# Patient Record
Sex: Female | Born: 1935 | Race: White | Hispanic: No
Health system: Southern US, Community
[De-identification: ages and names within clinical notes are randomized; demographics above are authoritative.]

---

## 2006-12-12 ENCOUNTER — Other Ambulatory Visit: Payer: Self-pay

## 2006-12-12 ENCOUNTER — Ambulatory Visit: Payer: Self-pay | Admitting: Ophthalmology

## 2006-12-20 ENCOUNTER — Ambulatory Visit: Payer: Self-pay | Admitting: Ophthalmology

## 2007-02-20 ENCOUNTER — Ambulatory Visit: Payer: Self-pay | Admitting: Ophthalmology

## 2007-03-07 ENCOUNTER — Ambulatory Visit: Payer: Self-pay | Admitting: Ophthalmology

## 2009-04-01 ENCOUNTER — Emergency Department: Payer: Self-pay | Admitting: Emergency Medicine

## 2010-03-24 ENCOUNTER — Emergency Department: Payer: Self-pay | Admitting: Emergency Medicine

## 2010-06-17 ENCOUNTER — Emergency Department: Payer: Self-pay | Admitting: Unknown Physician Specialty

## 2010-10-05 ENCOUNTER — Emergency Department: Payer: Self-pay | Admitting: Emergency Medicine

## 2011-02-13 ENCOUNTER — Ambulatory Visit: Payer: Self-pay | Admitting: Internal Medicine

## 2011-02-14 ENCOUNTER — Emergency Department: Payer: Self-pay | Admitting: *Deleted

## 2011-03-10 ENCOUNTER — Inpatient Hospital Stay: Payer: Self-pay | Admitting: Internal Medicine

## 2011-03-16 ENCOUNTER — Ambulatory Visit: Payer: Self-pay | Admitting: Internal Medicine

## 2011-04-10 ENCOUNTER — Emergency Department: Payer: Self-pay | Admitting: Internal Medicine

## 2011-04-10 LAB — CBC
HCT: 34.9 % — ABNORMAL LOW (ref 35.0–47.0)
MCV: 90 fL (ref 80–100)
Platelet: 384 10*3/uL (ref 150–440)
RBC: 3.86 10*6/uL (ref 3.80–5.20)

## 2011-04-10 LAB — COMPREHENSIVE METABOLIC PANEL
Albumin: 3 g/dL — ABNORMAL LOW (ref 3.4–5.0)
Anion Gap: 7 (ref 7–16)
BUN: 25 mg/dL — ABNORMAL HIGH (ref 7–18)
Bilirubin,Total: 0.4 mg/dL (ref 0.2–1.0)
Chloride: 100 mmol/L (ref 98–107)
Creatinine: 0.86 mg/dL (ref 0.60–1.30)
EGFR (African American): >60
Potassium: 3.5 mmol/L (ref 3.5–5.1)
SGPT (ALT): 15 U/L
Total Protein: 6.9 g/dL (ref 6.4–8.2)

## 2011-04-10 LAB — URINALYSIS, COMPLETE
Bilirubin,UR: NEGATIVE
Blood: NEGATIVE
Ketone: NEGATIVE
Leukocyte Esterase: NEGATIVE
Nitrite: NEGATIVE
Ph: 5 (ref 4.5–8.0)
Protein: NEGATIVE
RBC,UR: NONE SEEN /HPF (ref 0–5)
Specific Gravity: 1.023 (ref 1.003–1.030)
Squamous Epithelial: <1
WBC UR: <1 /HPF (ref 0–5)

## 2011-04-10 LAB — TROPONIN I: Troponin-I: 0.02 ng/mL

## 2011-04-10 LAB — CK TOTAL AND CKMB (NOT AT ARMC)
CK, Total: 21 U/L (ref 21–215)
CK-MB: 0.7 ng/mL (ref 0.5–3.6)

## 2011-05-14 DEATH — deceased

## 2013-07-29 IMAGING — CT CT HEAD WITHOUT CONTRAST
2 series · 15 of 30 positions shown, 19 images · non-contrast
Comparison: none

REASON FOR EXAM: head trauma
COMMENTS:

[Series 2: without · axial · non-contrast · 0.43mm/px · z∈[-139,-9]mm · 13 of 32 slices shown, 17 images]
[im 3/32  brain]
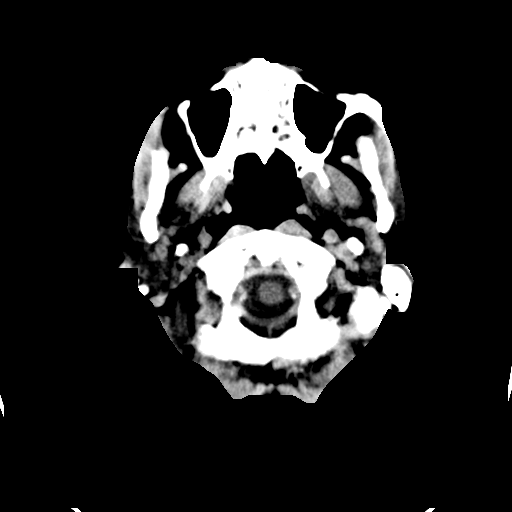
[im 3/32  bone]
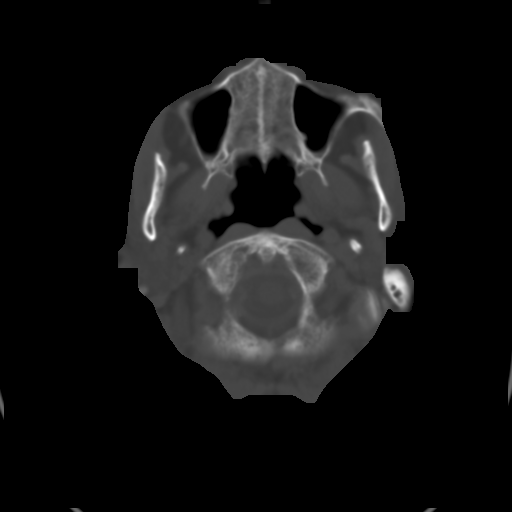
[im 5/32  brain]
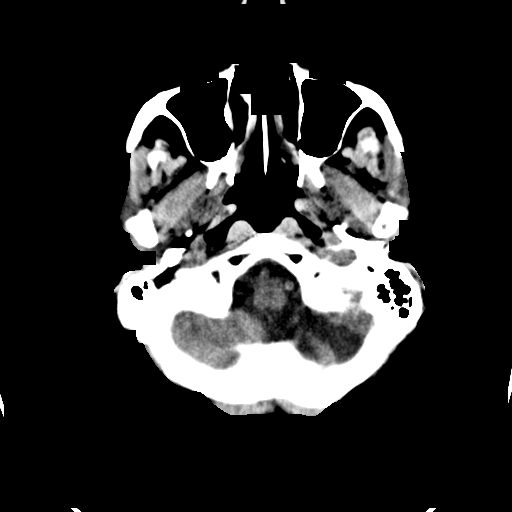
[im 7/32  brain]
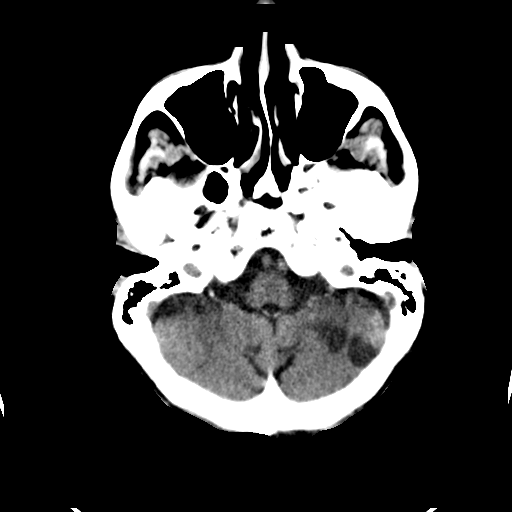
[im 9/32  brain]
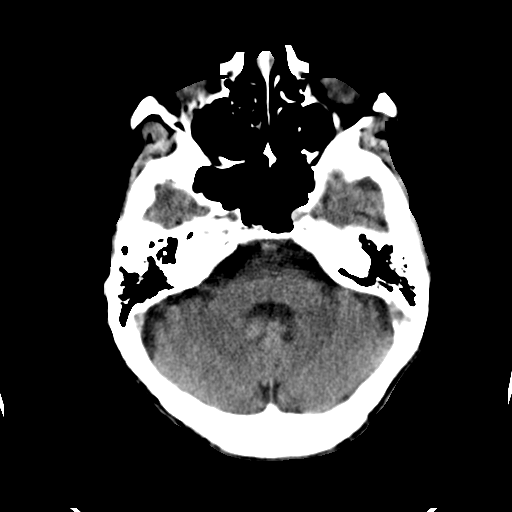
[im 12/32  brain]
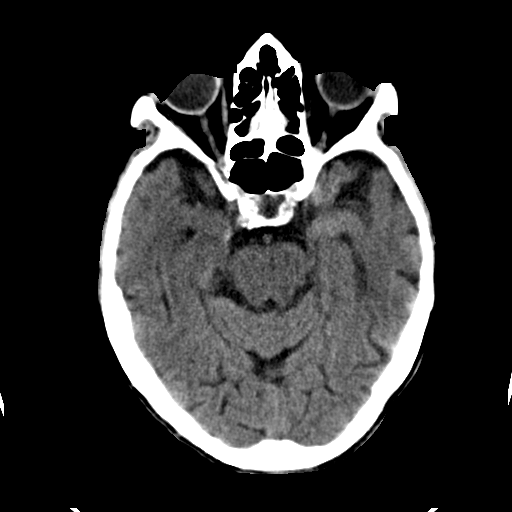
[im 12/32  bone]
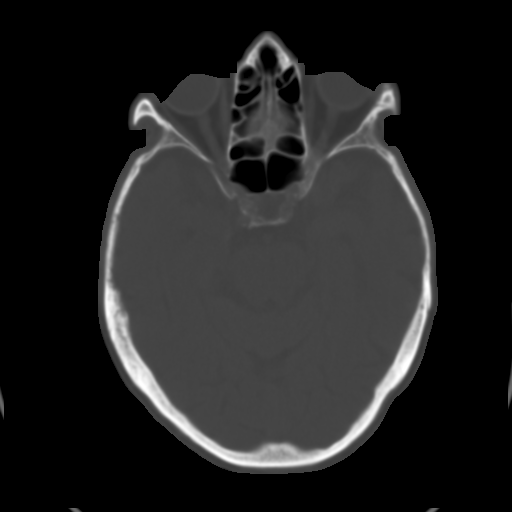
[im 14/32  brain]
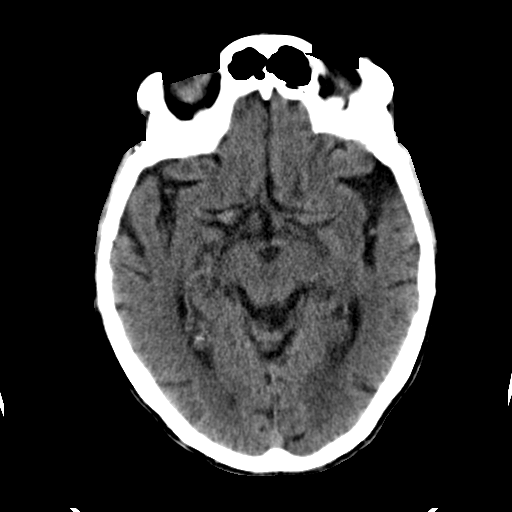
[im 16/32  brain]
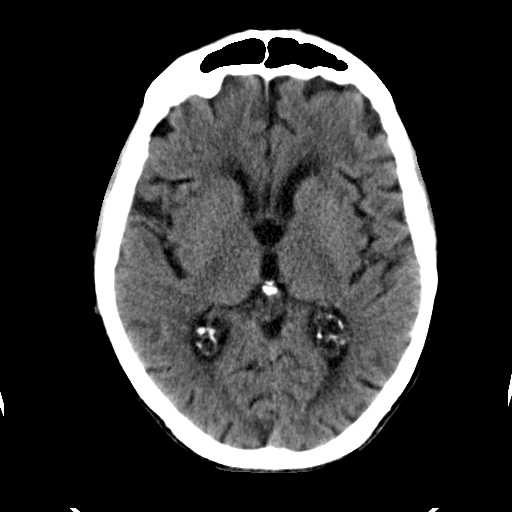
[im 18/32  brain]
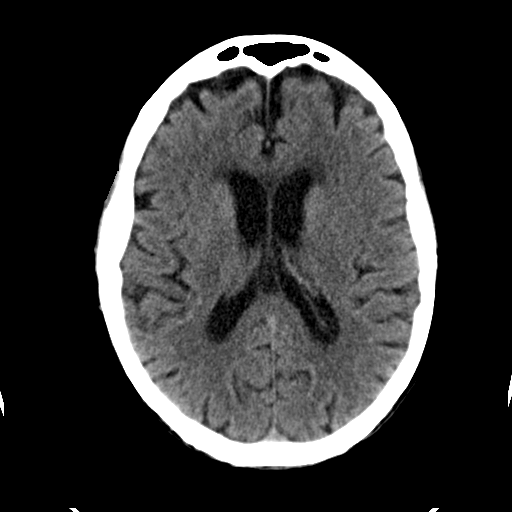
[im 20/32  brain]
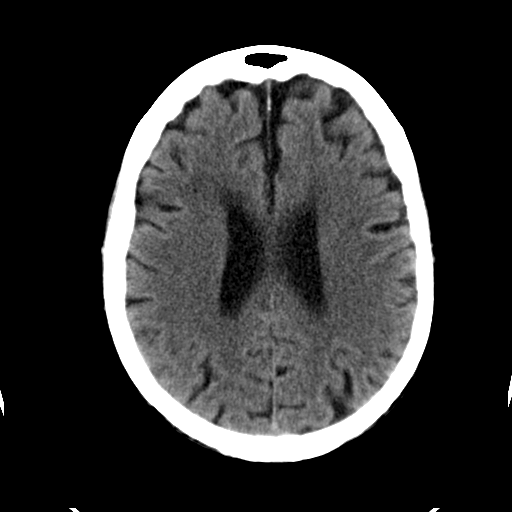
[im 20/32  bone]
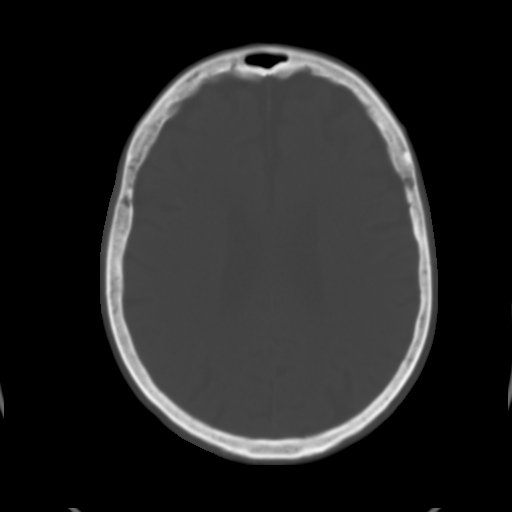
[im 23/32  brain]
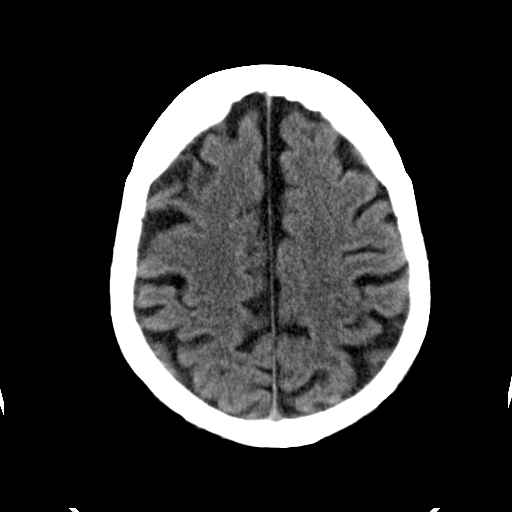
[im 25/32  brain]
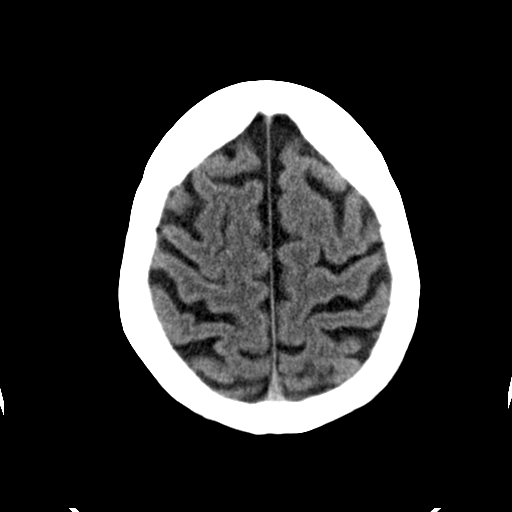
[im 27/32  brain]
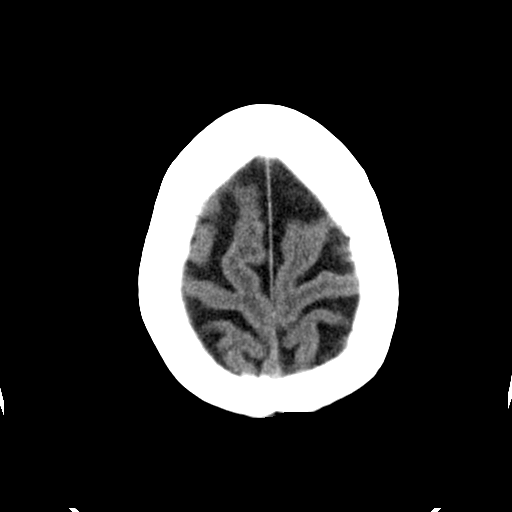
[im 29/32  brain]
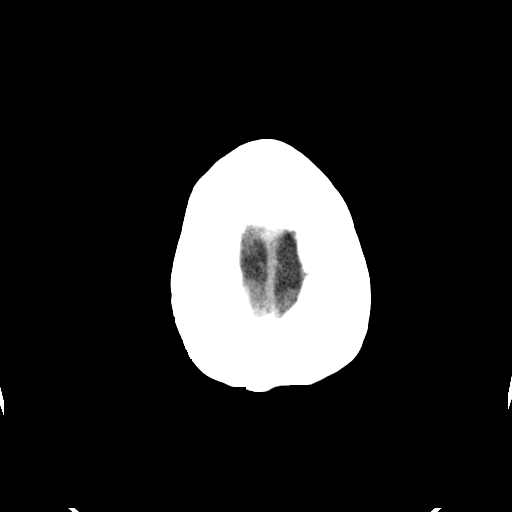
[im 29/32  bone]
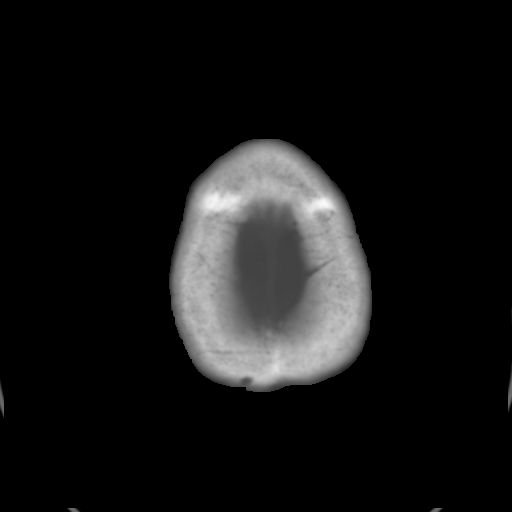

[Series 3: bone · axial · 0.43mm/px · z∈[-139,-119]mm · 2 of 32 slices shown]
[im 3/32  bone]
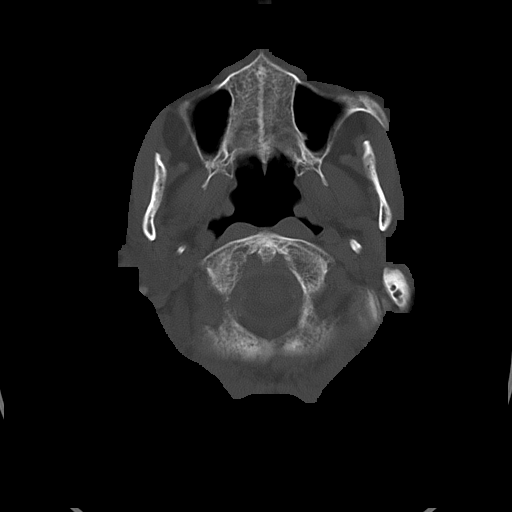
[im 7/32  bone]
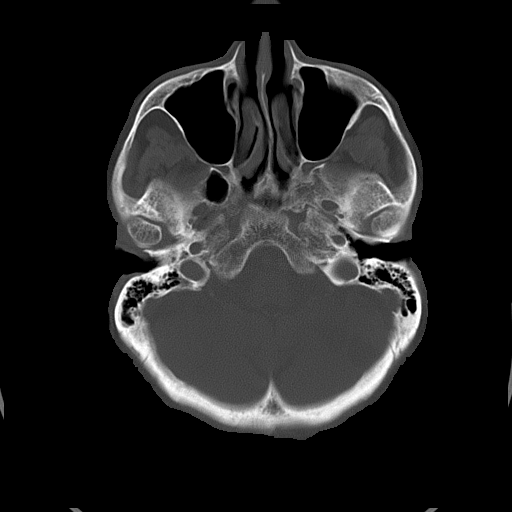

[15 of 30 positions shown; findings below may reference images not displayed]

PROCEDURE:     CT  - CT HEAD WITHOUT CONTRAST  - February 14, 2011  [DATE]

RESULT:     Axial CT scanning was performed through the brain with
reconstructions at 5 mm intervals and slice thicknesses.

There is mild diffuse cerebral and cerebellar atrophy. There is
encephalomalacia in the left cerebellar hemisphere. There is no intracranial
hemorrhage nor intracranial mass effect. There is subtle hypodensity in the
deep white matter of the right frontal lobe consistent with chronic small
vessel ischemic type change The ventricles are normal in size and position.
At bone window settings the observed portions of the paranasal sinuses and
mastoid air cells are clear. There is no evidence of an acute skull fracture.
IMPRESSION: 1. There is no evidence of an acute intracranial hemorrhage.
2. There are chronic changes consistent with small vessel ischemia. Old
infarct in the left cerebellar hemisphere and in the deep white matter of
the right frontal lobe are present.
3. I do not see evidence of an acute skull fracture.

## 2013-07-29 IMAGING — CT CT CERVICAL SPINE WITHOUT CONTRAST
1 series · 12 of 14 positions shown, 15 images · non-contrast
Comparison: none

REASON FOR EXAM: head trauma
COMMENTS:

[Series 6: axial · axial · 0.33mm/px · z∈[-271,-121]mm · 12 of 88 slices shown, 15 images]
[im 7/88  soft-tissue]
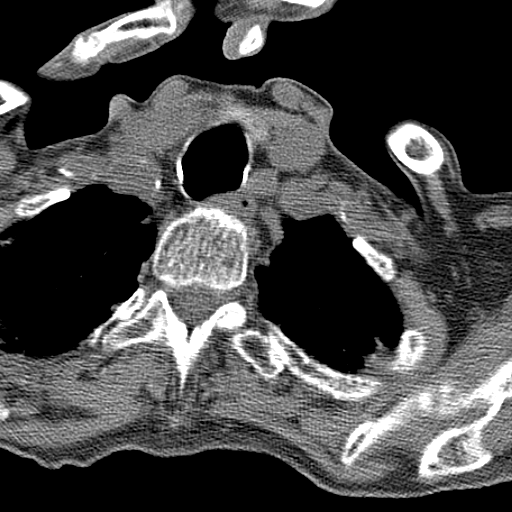
[im 7/88  bone]
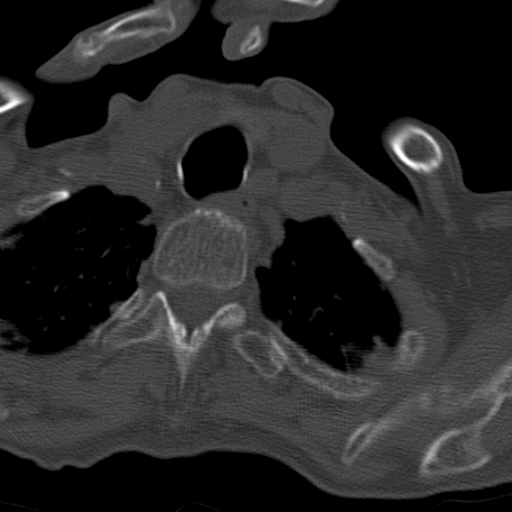
[im 14/88  bone]
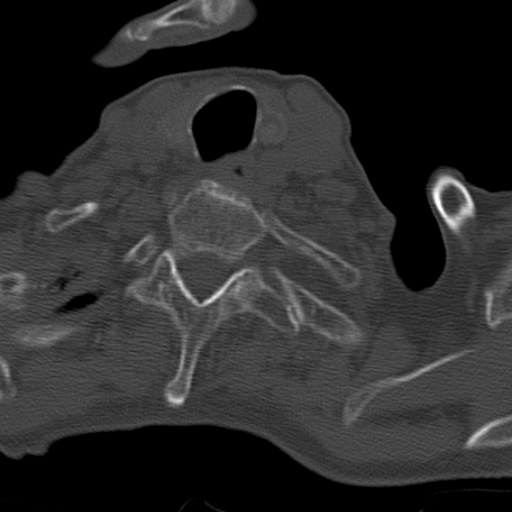
[im 21/88  bone]
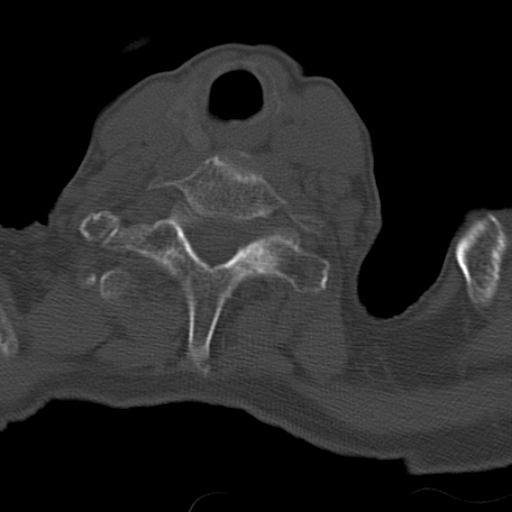
[im 27/88  bone]
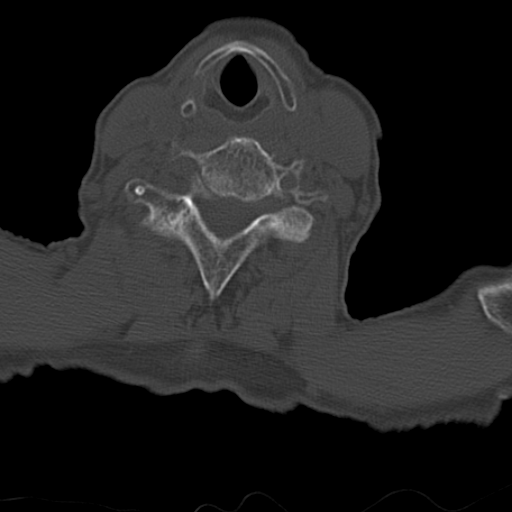
[im 34/88  soft-tissue]
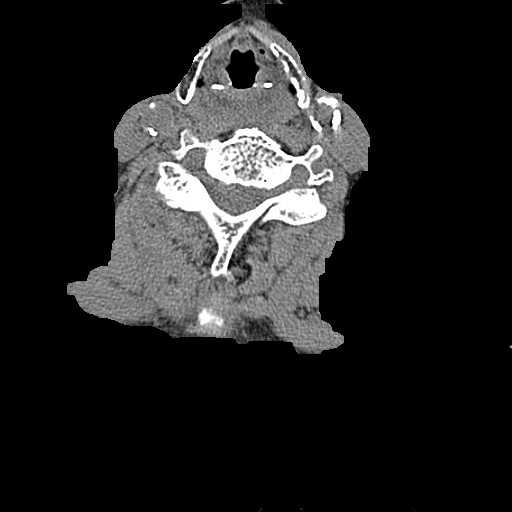
[im 34/88  bone]
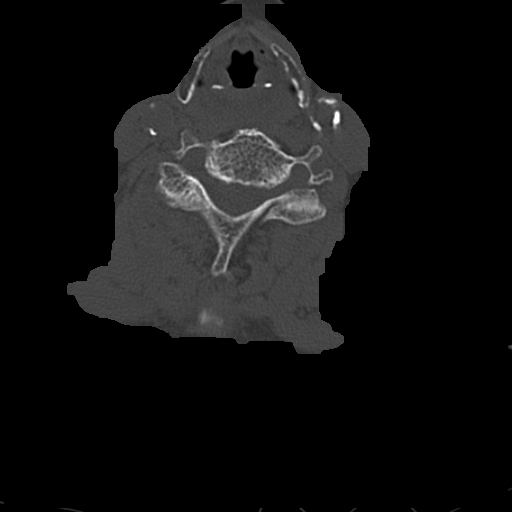
[im 41/88  bone]
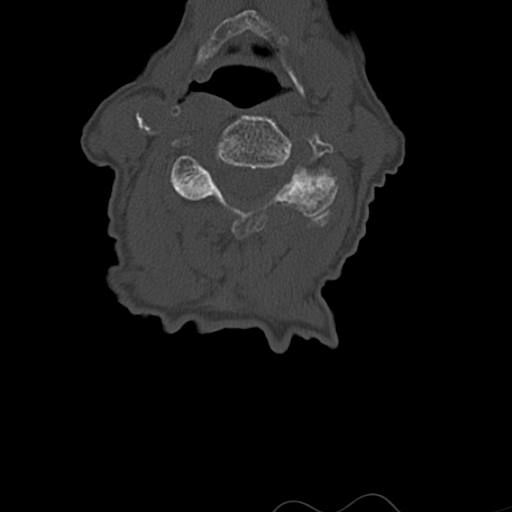
[im 47/88  bone]
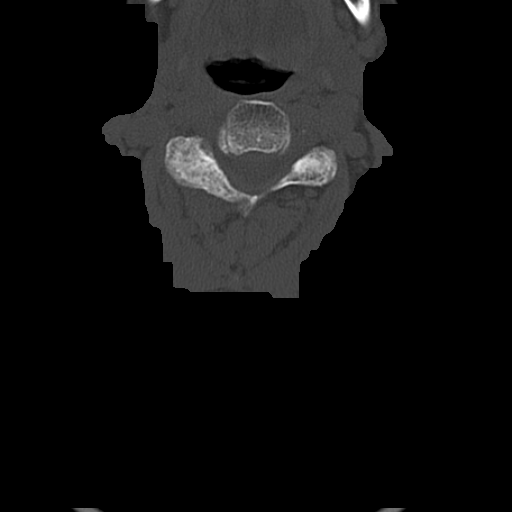
[im 54/88  bone]
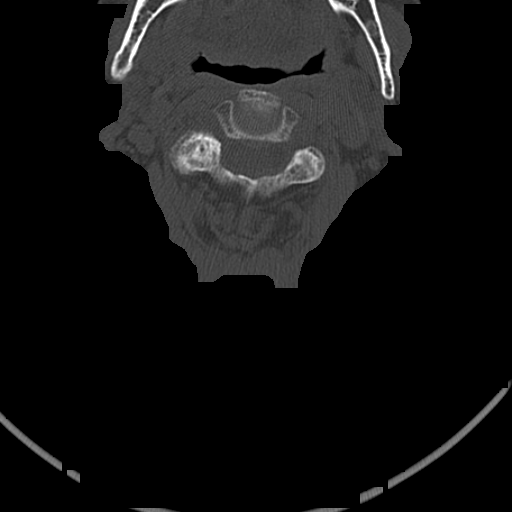
[im 61/88  soft-tissue]
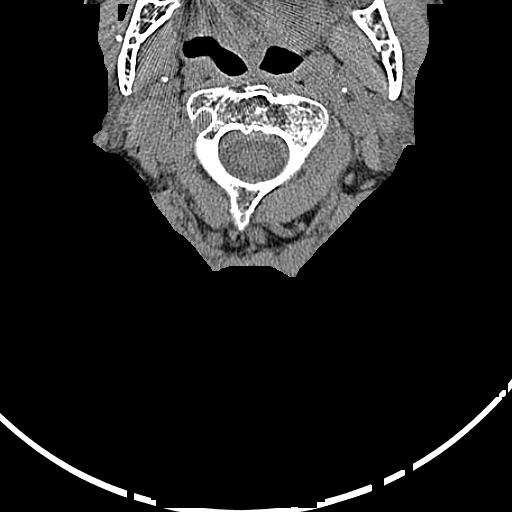
[im 61/88  bone]
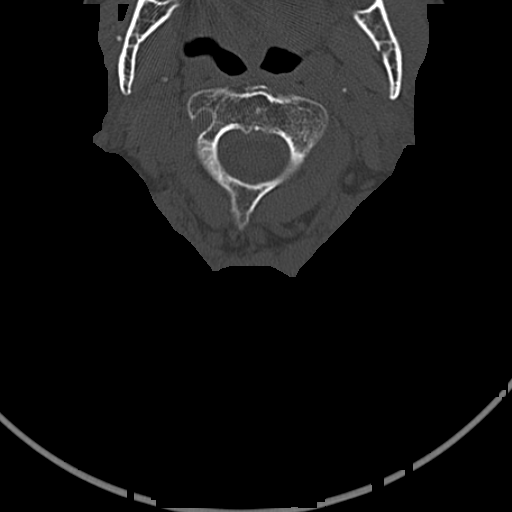
[im 67/88  bone]
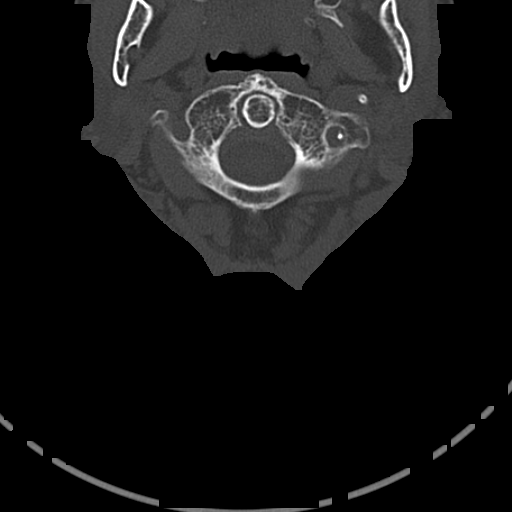
[im 74/88  bone]
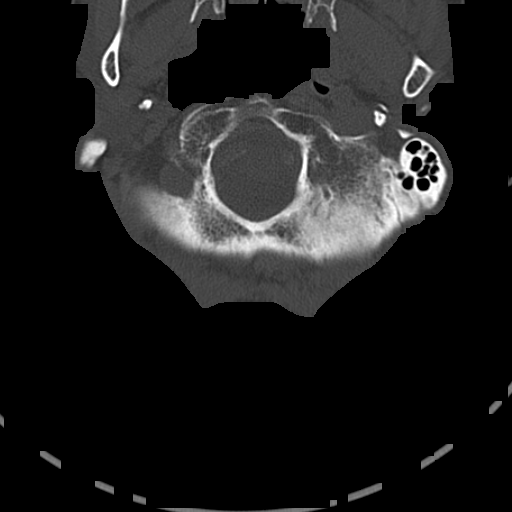
[im 81/88  bone]
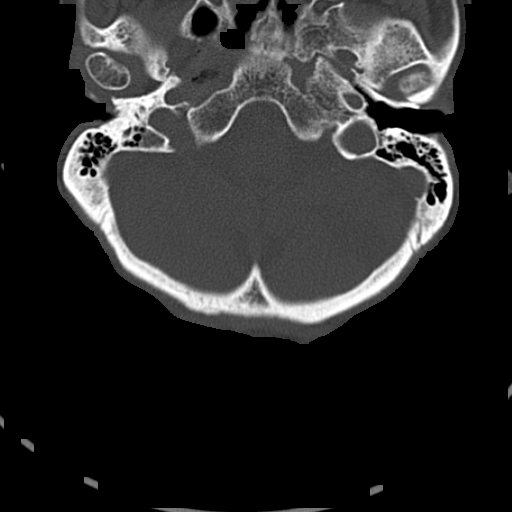

[12 of 14 positions shown; findings below may reference images not displayed]

PROCEDURE:     CT  - CT CERVICAL SPINE WO  - February 14, 2011  [DATE]

RESULT:     Sagittal, axial, and coronal images through the cervical spine
are reviewed.

The cervical vertebral bodies are preserved in height. There is disc space
narrowing at C5-C6. The prevertebral soft tissue spaces appear normal. The
spinous processes are intact. There is facet joint degenerative change at
multiple upper and mid cervical levels. The odontoid is intact. There are
mild degenerative changes of the atlantodens articulation. I do not see AP
dimensional spinal canal stenosis. At lung window settings there is apical
pleural scarring bilaterally.
IMPRESSION: 1. I do not see evidence of acute cervical spine fracture nor dislocation.
2. There is moderate age-related degenerative change of the cervical spine.
No high-grade compression is demonstrated.
3. There is heterogeneity of the density of the thyroid gland with
nodularity noted on the left.
4. There is apical pleural scarring in both lungs.

## 2013-08-22 IMAGING — CR DG CHEST 1V PORT
1 series · 1 of 1 positions shown · non-contrast
Comparison: none

REASON FOR EXAM: PNA cough low o2
COMMENTS:

[portable]
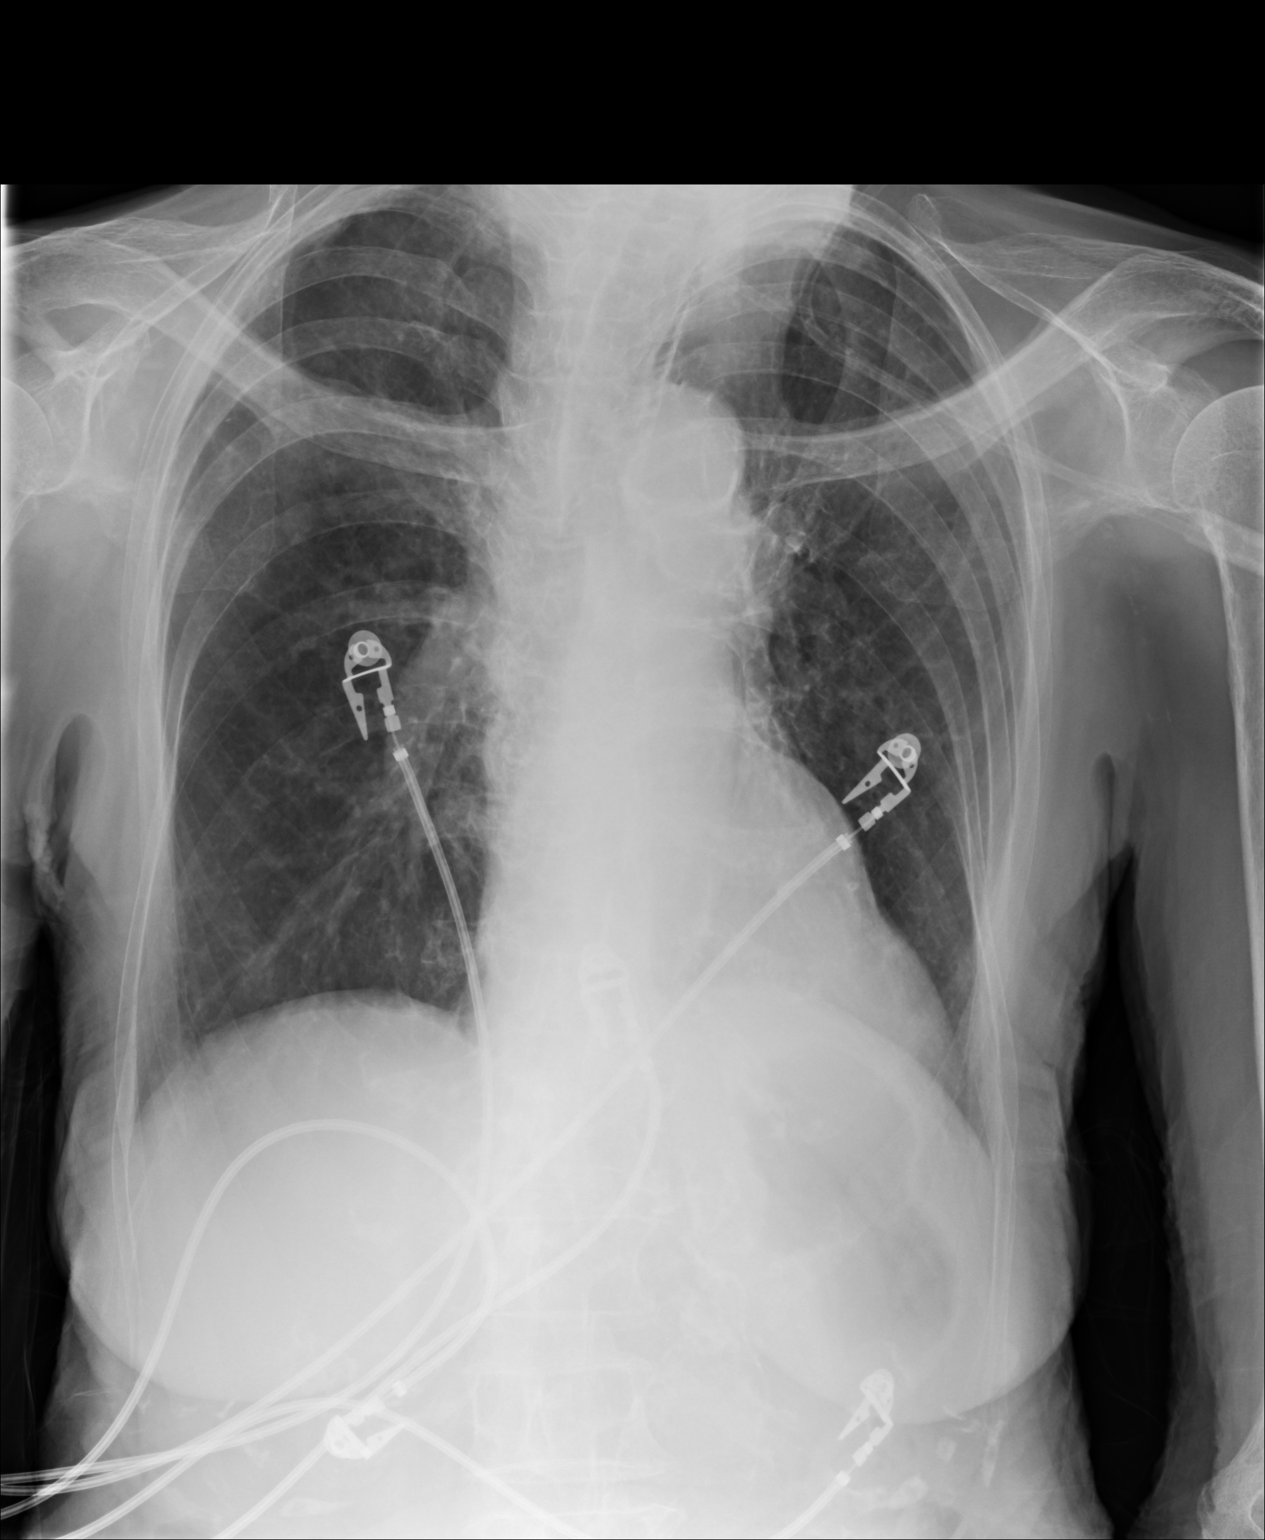

[1 of 1 positions shown; findings below may reference images not displayed]

PROCEDURE:     DXR - DXR PORTABLE CHEST SINGLE VIEW  - March 10, 2011 [DATE]

RESULT:     Comparison is made to study dated 06 October, 2010.

The lungs are hyperinflated but appear clear. The cardiac silhouette is
normal. Atherosclerotic calcification is present within the aortic arch. Is
no edema, mass, effusion or pneumothorax.
IMPRESSION: 1. Hyperinflation consistent with COPD. The no significant interval change.

## 2013-08-24 IMAGING — CR DG FOOT COMPLETE 3+V*L*
1 series · 3 of 3 positions shown · non-contrast
Comparison: none

REASON FOR EXAM: pain to touch
COMMENTS:

[Series 1: ap · 0.17mm/px · 3 of 3 slices shown]
[im 1/3]
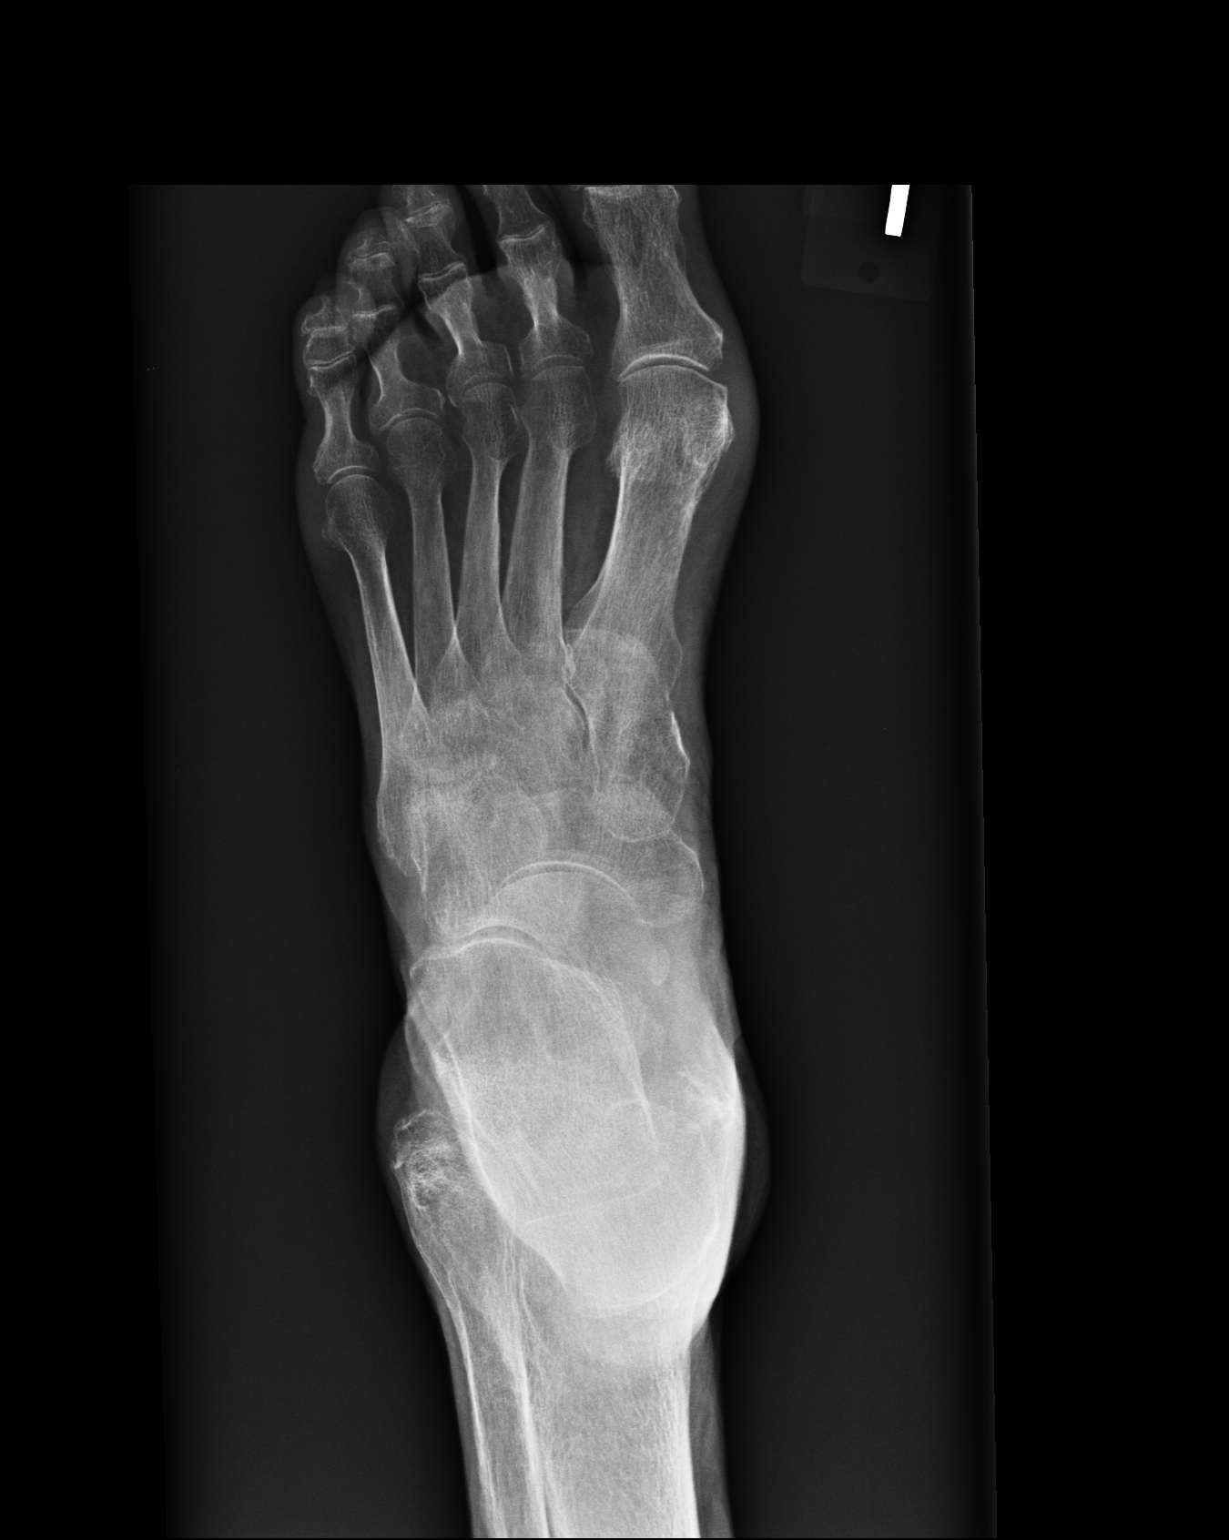
[im 2/3]
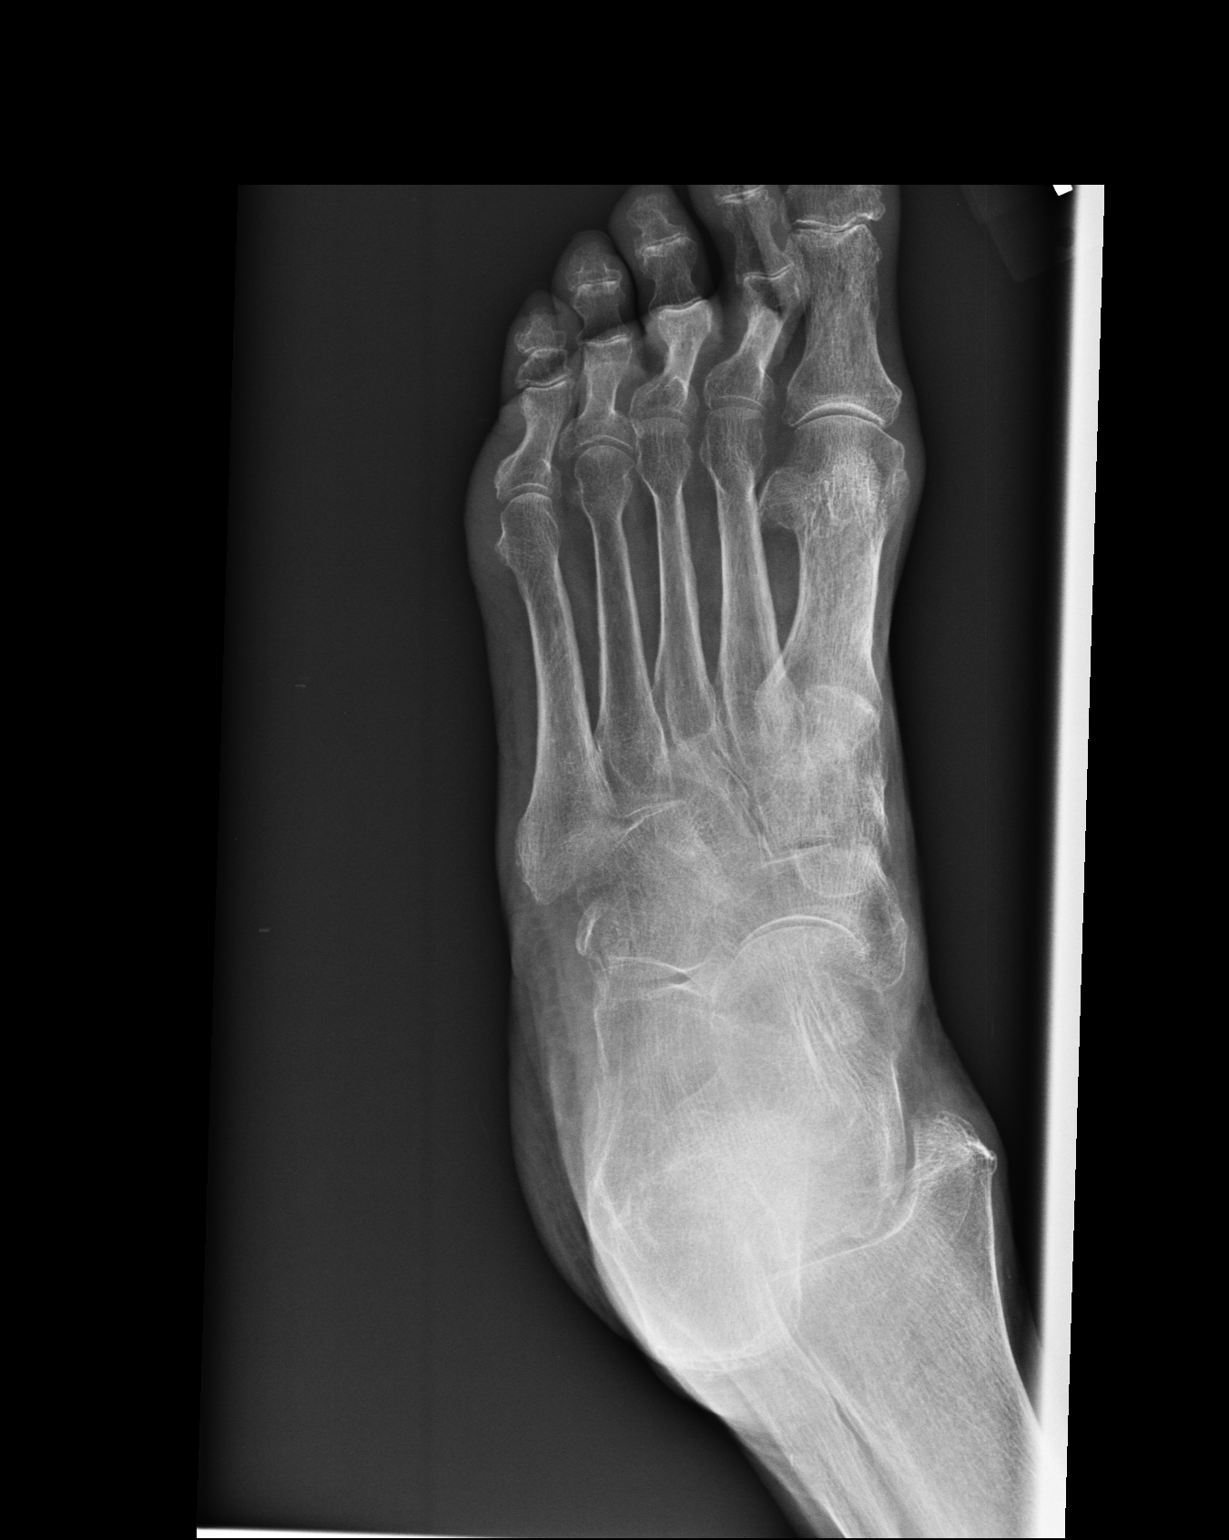
[im 3/3]
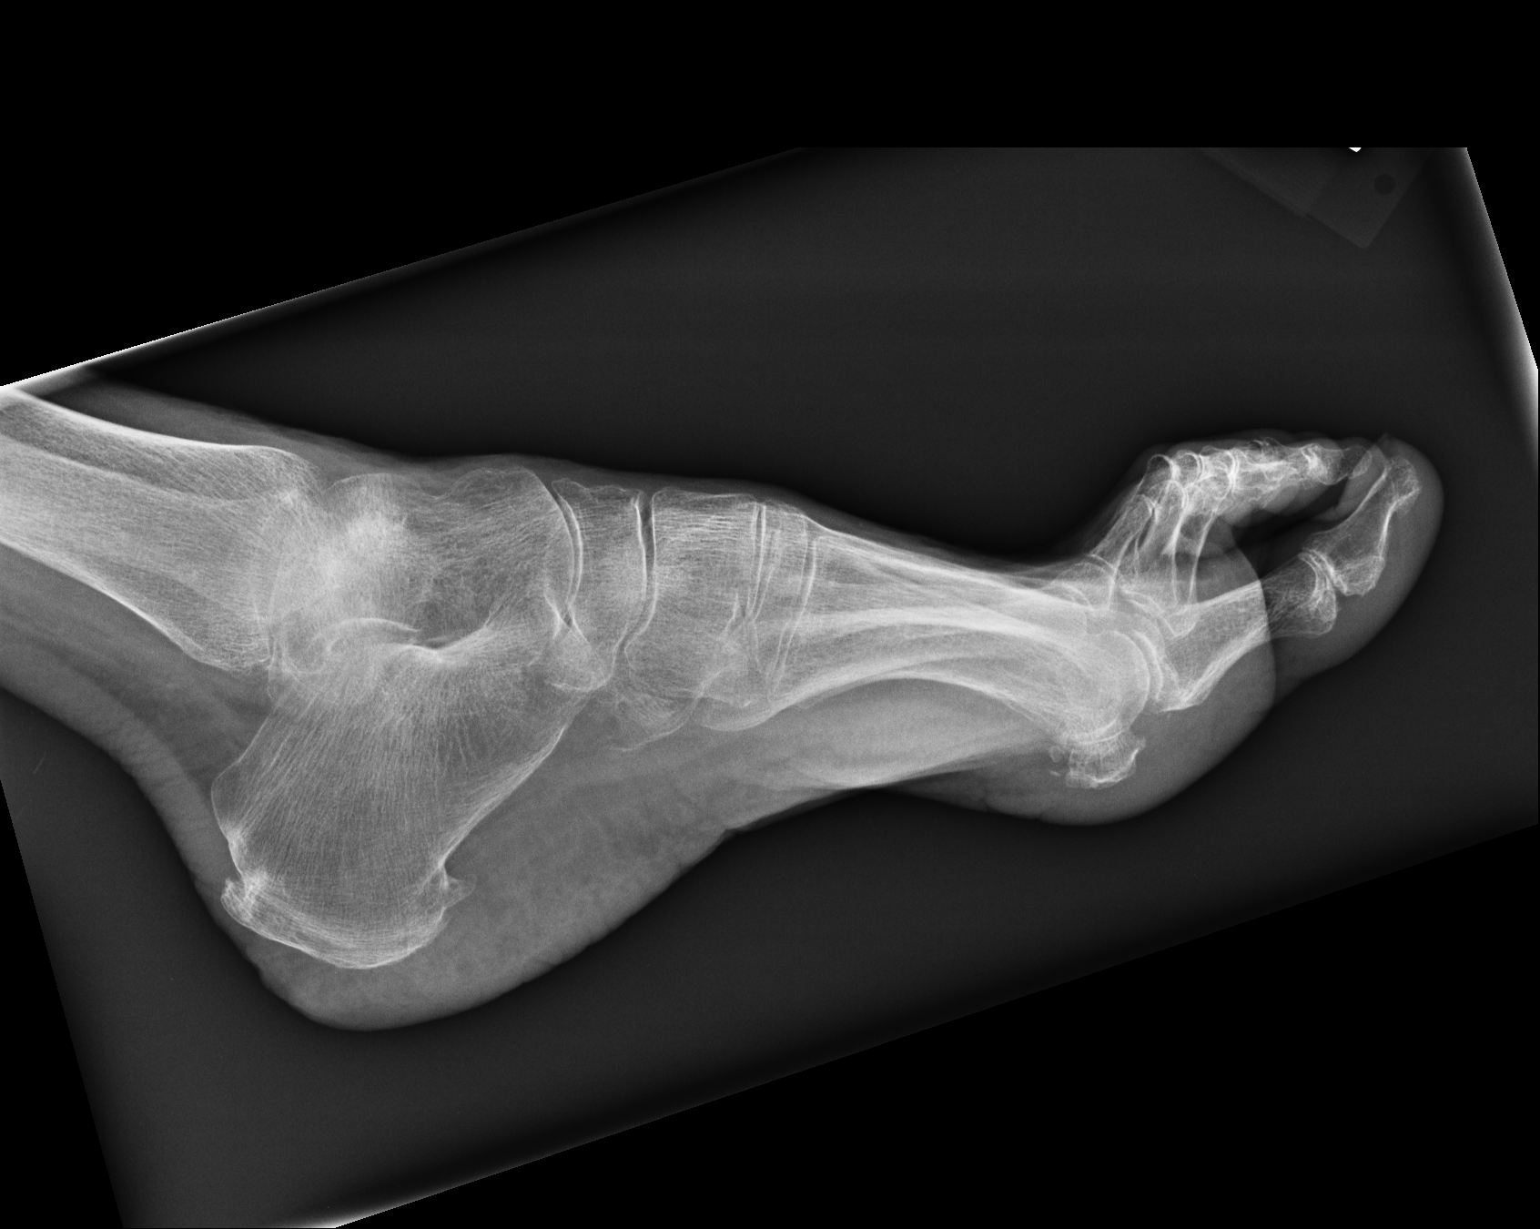

[3 of 3 positions shown; findings below may reference images not displayed]

PROCEDURE:     DXR - DXR FOOT LT COMP W/OBLIQUES  - March 12, 2011  [DATE]

RESULT:     Three views of the left foot are submitted. The site of the
patient's tenderness is not noted in the clinical history. The bones are
osteopenic. There is degenerative interphalangeal joint and
metatarsophalangeal joint change. There are degenerative changes of the
intertarsal joints. There are spurs from the plantar and Achilles region of
the calcaneus. There are suspected degenerative changes of the tibiotalar
joint but this joint is not well evaluated on this foot series.
IMPRESSION: I do not see evidence of an acute fracture of the bones of
the foot. There is diffuse osteopenia. If there are strong clinical concerns
of occult fracture, CT scanning is available upon request.

## 2014-07-07 NOTE — H&P (Signed)
PATIENT NAME:  Molly HedgerCOUNCILMAN, Molly Browning MR#:  [id number] DATE OF BIRTH:  [birth year]  DATE OF ADMISSION:  03/10/2011  PRIMARY CARE PHYSICIAN: Dr. Conchita Parison Chaplin  REFERRING ER PHYSICIAN: Dr. Dorothea GlassmanPaul Malinda    REASON FOR ADMISSION:  Shortness of breath, cough.  HISTORY OF PRESENT ILLNESS: The patient is a 81 year old white female with dementia who [facility] who apparently has had a cough for the last 10 to 12 days and was treated with azithromycin. However, her cough has progressively gotten worse. The patient also was noted to be short of breath this morning when sent to the ED. She was noted to have wheezing.  Her chest x-ray showed chronic obstructive pulmonary disease changes. The patient has no history of chronic obstructive pulmonary disease. Chronically the patient is able to ambulate according to her daughter-in-law without any difficulties. She has also not been eating or drinking over the last 10 to 12 days. She had fevers initially but her daughter- in-law says that she has not had fevers recently. She otherwise has not had any diarrhea, nausea or vomiting. Otherwise review of systems is unobtainable due to the patient's dementia.   PAST MEDICAL HISTORY:  1. Dementia.  2. Failure to thrive has progressively gotten worse in terms of weight loss and generalized weakness over the past one year.  3. Hypertension.  4. Chronic atrial fibrillation on chronic Coumadin.  5. History of uterine cancer, status post hysterectomy and oophorectomy.  No history of chronic obstructive pulmonary disease or other lung disease according to her daughter-in-law.   PAST SURGICAL HISTORY: Hysterectomy, oophorectomy.   ALLERGIES: Septra.   CURRENT MEDICATIONS AT THE FACILITY:  1. Artificial drops, two drops to both eyes four times daily. 2. Bismatrol 262 mg/15 mL solution, 15 mL q. 4 p.r.n. for nausea, vomiting, and loose stools. 3. Digoxin 125 mcg 1 tab every other day.   4. Diltiazem CD 180 mg daily.  5. Donepezil 10 daily.  6. Iron sulfate 45 mg, 1 tab daily.  7. Hydroxyzine 10 mg q. 6 p.r.n.  8. Mapap 325, 2 tabs q. 6 p.r.n. for pain or fever greater than 100. 9. Milk of Magnesia 30 mL b.i.d. as needed for constipation.  10. Robitussin 5 mL 4 times per day until cough is cleared. 11. Sarna 0.5% topical lotion, apply to affected area topically 4 times a day as needed for itching. 12. Tab-A-Vite 1 tab daily.  13. Triamcinolone 0.1% topical cream, apply to face once a day, started on 21, end date 12/27. 14. Triamcinolone 0.1% topical cream apply to right shoulder and right buttock 2 times per day. 15. Warfarin 3 mg 1 tablet Monday, Friday, 4 mg Tuesday, Wednesday, Thursday, and Saturday.   SOCIAL HISTORY: No smoking. No alcohol. [facility]. Her health care power of attorney is her daughter-in-law.   FAMILY HISTORY: Positive for hypertension.  REVIEW OF SYSTEMS: Unobtainable due to patient's dementia.   PHYSICAL EXAMINATION:  VITAL SIGNS: Temperature 99.7, pulse 81, respirations 32 on presentation, blood pressure was 194/92, O2 90% on room air.   GENERAL: The patient is a 81 year old chronically debilitated skinny white female, appears a little lethargic, not in any acute distress.   HEENT: Head atraumatic, normocephalic. Pupils are equal, round, reactive to light and accommodation. Extraocular movements intact. Nasal exam shows no erythema or drainage. Oropharynx shows no exudate or erythema. Ear exam shows no erythema or drainage.   NECK: There is no thyromegaly. No carotid bruits. Supple.  CARDIOVASCULAR: Irregularly irregular rhythm. No murmurs, rubs, clicks, or gallops. PMI nondisplaced.   LUNGS: Diminished breath sounds without currently any rales, rhonchi, or wheezing.   ABDOMEN: Soft, nontender, nondistended. Positive bowel sounds times four. There is no hepatosplenomegaly.   EXTREMITIES: No clubbing, cyanosis, or  edema.    NEUROLOGICAL: The patient is lethargic but opens her eyes and answers some questions. Cranial nerves II through XII grossly intact. Has generalized weakness.   PSYCHIATRIC: Not anxious or depressed currently.   SKIN: She has some erythema involving her forehead without any distinct rash.   VASCULAR: Good DP, PT pulses.   LYMPHATICS: No lymph nodes palpable.   MUSCULOSKELETAL: There is no erythema or swelling.   LABS/STUDIES: In the Emergency Department: Urinalysis shows leukocyte trace, WBCs 4. Influenza A and B were negative. Chest x-ray shows hyperinflated chest with chronic obstructive pulmonary disease changes. No acute abnormality. BMP: Glucose 95, BUN 17, creatinine 0.88, sodium 136, potassium 3.8, chloride 95, CO2 34. LFTs showed a total protein of 8.7. WBC 9, hemoglobin 13.9, platelet count 267, troponin less than 0.02. Digoxin 0.78. INR is 3.7. EKG shows atrial fibrillation, septal infarct, nonspecific ST-T wave changes.   ASSESSMENT AND PLAN: The patient is a 81 year old white female with history of dementia, hypertension, and atrial fibrillation. No history of chronic obstructive pulmonary disease. Has had cough and shortness of breath for past 10 days. Not eating or drinking. Sent to the ED for shortness of breath, noted to have decreased air movement, rhonchi/wheezing on presentation. She has received two breathing treatments.  1. Shortness of breath, cough: Likely due to acute bronchitis with decreased air movement and initially had some wheezing with chest x-ray showing findings consistent with chronic obstructive pulmonary disease, possible senile chronic obstructive pulmonary disease with acute exacerbation. At this time we will treat her with nebulizers q. 6, place her on IV Solu-Medrol 40 mg IV every eight hours, and antibiotic with Levaquin. O2 as needed.  2. Decreased p.o. intake with dehydration: We will give her IV fluids.  3. Atrial fibrillation: Heart rate  currently controlled on Coumadin. Hold due to elevated INR. Continue diltiazem.  We will place her on telemetry.  4. Hypertension, accelerated here: We will continue diltiazem, add hydralazine p.r.n., add additional medication as needed.  5. CODE STATUS: DO NOT RESUSCITATE.  6. Miscellaneous: The patient's INR is more than therapeutic so that should be prophylactic for deep vein thrombosis.     time spent : 35 minutes ____________________________ Lacie Scotts. Allena Katz, MD shp:[id number] D: 03/10/2011 14:22:47 ET T: 03/10/2011 14:32:43 ET JOB#: [id number]  cc: Naiah Donahoe H. Allena Katz, MD, <Dictator> Jimmie Molly. Candelaria Stagers, MD Charise Carwin MD ELECTRONICALLY SIGNED 03/20/2011 15:20
